# Patient Record
Sex: Male | Born: 1990 | Hispanic: Yes | Marital: Married | Smoking: Light tobacco smoker
Health system: Southern US, Community
[De-identification: ages and names within clinical notes are randomized; demographics above are authoritative.]

---

## 2016-12-10 ENCOUNTER — Emergency Department
Admission: EM | Admit: 2016-12-10 | Discharge: 2016-12-10 | Disposition: A | Discharge status: Home / Self Care | Payer: Worker's Compensation | Attending: Emergency Medicine | Admitting: Emergency Medicine

## 2016-12-10 ENCOUNTER — Encounter: Payer: Self-pay | Admitting: Emergency Medicine

## 2016-12-10 DIAGNOSIS — Y999 Unspecified external cause status: Secondary | ICD-10-CM | POA: Insufficient documentation

## 2016-12-10 DIAGNOSIS — Y929 Unspecified place or not applicable: Secondary | ICD-10-CM | POA: Insufficient documentation

## 2016-12-10 DIAGNOSIS — Y939 Activity, unspecified: Secondary | ICD-10-CM | POA: Diagnosis not present

## 2016-12-10 DIAGNOSIS — S60552A Superficial foreign body of left hand, initial encounter: Secondary | ICD-10-CM | POA: Insufficient documentation

## 2016-12-10 DIAGNOSIS — W458XXA Other foreign body or object entering through skin, initial encounter: Secondary | ICD-10-CM | POA: Insufficient documentation

## 2016-12-10 DIAGNOSIS — S6992XA Unspecified injury of left wrist, hand and finger(s), initial encounter: Secondary | ICD-10-CM | POA: Diagnosis present

## 2016-12-10 MED ORDER — SULFAMETHOXAZOLE-TRIMETHOPRIM 800-160 MG PO TABS: 1.0000 | ORAL_TABLET | Freq: Two times a day (BID) | ORAL | 0 refills | Status: DC

## 2016-12-10 MED ORDER — LIDOCAINE HCL (PF) 1 % IJ SOLN
INTRAMUSCULAR | Status: AC
  Administered 2016-12-10: 12:00:00
  Filled 2016-12-10: qty 5

## 2016-12-10 MED ORDER — OXYCODONE-ACETAMINOPHEN 5-325 MG PO TABS
1.0000 | ORAL_TABLET | Freq: Once | ORAL | Status: AC
  Administered 2016-12-10: 1 via ORAL
  Filled 2016-12-10: qty 1

## 2016-12-10 MED ORDER — SULFAMETHOXAZOLE-TRIMETHOPRIM 800-160 MG PO TABS
1.0000 | ORAL_TABLET | Freq: Once | ORAL | Status: AC
  Administered 2016-12-10: 1 via ORAL
  Filled 2016-12-10: qty 1

## 2016-12-10 MED ORDER — TRAMADOL HCL 50 MG PO TABS: 50.0000 mg | ORAL_TABLET | Freq: Four times a day (QID) | ORAL | 0 refills | Status: AC | PRN

## 2016-12-10 NOTE — ED Provider Notes (Signed)
[facility] Emergency Department Provider Note   ____________________________________________   First MD Initiated Contact with Patient 12/10/16 1116     (approximate)  I have reviewed the triage vital signs and the nursing notes.   HISTORY  Chief Complaint Hand Injury    HPI Manuel Porter is a 26 y.o. male suspected foreign body left hand. Patient stated 2 weeks ago he had a splinter of wood in his left hand. Patient thought he removed it totally on the dating incident. Patient state in the past 2-3 days he feel the foreign body still under the skin.Patient denies loss sensation or loss of function of the affected hand. Increased redness and swelling in the last 24 hours. Patient is right-hand dominant. Patient rates pain as a 5/10. Patient described the pain as "achy". No palliative measures for his complaint.   History reviewed. No pertinent past medical history.  There are no active problems to display for this patient.   History reviewed. No pertinent surgical history.  Prior to Admission medications   Medication Sig Start Date End Date Taking? Authorizing Provider  sulfamethoxazole-trimethoprim (BACTRIM DS,SEPTRA DS) 800-160 MG tablet Take 1 tablet by mouth 2 (two) times daily. 12/10/16   Joni Reiningonald K Smith, PA-C  traMADol (ULTRAM) 50 MG tablet Take 1 tablet (50 mg total) by mouth every 6 (six) hours as needed. 12/10/16 12/10/17  Joni Reiningonald K Smith, PA-C    Allergies Peanut butter flavor and Pineapple  History reviewed. No pertinent family history.  Social History Social History  Substance Use Topics  . Smoking status: Never Smoker  . Smokeless tobacco: Never Used  . Alcohol use No    Review of Systems Constitutional: No fever/chills Eyes: No visual changes. ENT: No sore throat. Cardiovascular: Denies chest pain. Respiratory: Denies shortness of breath. Gastrointestinal: No abdominal pain.  No nausea, no vomiting.  No diarrhea.  No  constipation. Genitourinary: Negative for dysuria. Musculoskeletal: Negative for back pain. Skin: Negative for rash. Foreign body sensation left hand Neurological: Negative for headaches, focal weakness or numbness.    ____________________________________________   PHYSICAL EXAM:  VITAL SIGNS: ED Triage Vitals  Enc Vitals Group     BP 12/10/16 1025 135/82     Pulse Rate 12/10/16 1025 61     Resp 12/10/16 1025 16     Temp 12/10/16 1025 98.1 F (36.7 C)     Temp Source 12/10/16 1025 Oral     SpO2 12/10/16 1025 98 %     Weight 12/10/16 1023 195 lb (88.5 kg)     Height 12/10/16 1023 5\' 8"  (1.727 m)     Head Circumference --      Peak Flow --      Pain Score 12/10/16 1023 5     Pain Loc --      Pain Edu? --      Excl. in GC? --     Constitutional: Alert and oriented. Well appearing and in no acute distress. Eyes: Conjunctivae are normal. PERRL. EOMI. Head: Atraumatic. Nose: No congestion/rhinnorhea. Mouth/Throat: Mucous membranes are moist.  Oropharynx non-erythematous. Neck: No stridor.  No cervical spine tenderness to palpation. Hematological/Lymphatic/Immunilogical: No cervical lymphadenopathy. Cardiovascular: Normal rate, regular rhythm. Grossly normal heart sounds.  Good peripheral circulation. Respiratory: Normal respiratory effort.  No retractions. Lungs CTAB. Gastrointestinal: Soft and nontender. No distention. No abdominal bruits. No CVA tenderness. Musculoskeletal: No lower extremity tenderness nor edema.  No joint effusions. Neurologic:  Normal speech and language. No gross focal neurologic deficits are appreciated.  No gait instability. Skin:  Skin is warm, dry and intact. No rash noted. foreign body  thenar fossa of the left hand. Psychiatric: Mood and affect are normal. Speech and behavior are normal.  ____________________________________________   LABS (all labs ordered are listed, but only abnormal results are displayed)  Labs Reviewed - No data to  display ____________________________________________  EKG   ____________________________________________  RADIOLOGY   ____________________________________________   PROCEDURES  Procedure(s) performed: None  Procedures  Critical Care performed: No  ____________________________________________   INITIAL IMPRESSION / ASSESSMENT AND PLAN / ED COURSE  Pertinent labs & imaging results that were available during my care of the patient were reviewed by me and considered in my medical decision making (see chart for details).  Foreign body left hand. Patient given discharge care instructions. Patient prescription for tramadol and Bactrim DS. Patient advised return back in 2 days for wound check.    ____________________________________________   FINAL CLINICAL IMPRESSION(S) / ED DIAGNOSES  Probable foreign body thenar fossa left hand. Area was surgical clean local block using 1% lidocaine without epinephrine. A #11 blade was used to excise involved area and foreign body was removed intact for this. Patient was bandaged with pressure dressing and advised return back in 2 days for wound check. Final diagnoses:  Foreign body of left hand, initial encounter      NEW MEDICATIONS STARTED DURING THIS VISIT:  New Prescriptions   SULFAMETHOXAZOLE-TRIMETHOPRIM (BACTRIM DS,SEPTRA DS) 800-160 MG TABLET    Take 1 tablet by mouth 2 (two) times daily.   TRAMADOL (ULTRAM) 50 MG TABLET    Take 1 tablet (50 mg total) by mouth every 6 (six) hours as needed.     Note:  This document was prepared using Dragon voice recognition software and may include unintentional dictation errors.    Joni Reining, PA-C 12/10/16 1212    Emily Filbert, MD 12/10/16 [id number]

## 2016-12-10 NOTE — ED Triage Notes (Signed)
Pt reports one week ago he was pulling a piece of wood and piece got in left hand. He thought it was all out but still feels like in there.  Works at [employer]. Reports worker comp.

## 2016-12-10 NOTE — Discharge Instructions (Signed)
Keep wound clean and dry.

## 2016-12-10 NOTE — ED Notes (Signed)
See triage note  States he had gotten a wood splinter in web space of left hand 1 week ago  Unable to remove entire splinter

## 2018-05-24 ENCOUNTER — Other Ambulatory Visit: Payer: Self-pay

## 2018-05-24 ENCOUNTER — Emergency Department: Payer: Self-pay

## 2018-05-24 ENCOUNTER — Emergency Department
Admission: EM | Admit: 2018-05-24 | Discharge: 2018-05-24 | Disposition: A | Discharge status: Home / Self Care | Payer: Self-pay | Attending: Emergency Medicine | Admitting: Emergency Medicine

## 2018-05-24 DIAGNOSIS — Z91010 Allergy to peanuts: Secondary | ICD-10-CM | POA: Insufficient documentation

## 2018-05-24 DIAGNOSIS — M25532 Pain in left wrist: Secondary | ICD-10-CM

## 2018-05-24 MED ORDER — NAPROXEN 500 MG PO TABS: 500.0000 mg | ORAL_TABLET | Freq: Two times a day (BID) | ORAL | 0 refills | Status: AC

## 2018-05-24 NOTE — ED Triage Notes (Signed)
Pt states he twisted his left wrist on Friday. No noted deformity

## 2018-05-24 NOTE — Discharge Instructions (Signed)
The xray of your left wrist is negative for fracture You likely have a wrist strain or tendonitis I have given you a RX for Naproxen 500 mg BID with food Avoid taking Motrin, Advil, Ibuprofen or Aleve OTC You can take Tylenol as needed for pain Wear a compression sleeve to left wrist while working until pain fully resolves

## 2018-05-24 NOTE — ED Notes (Signed)
Esignature pad not working. Unable to sign. Pt verbalized understanding of discharge paperwork and will follow up if necessary.

## 2018-05-24 NOTE — ED Provider Notes (Signed)
[facility] Emergency Department Provider Note ____________________________________________  Time seen: 1510  I have reviewed the triage vital signs and the nursing notes.  HISTORY  Chief Complaint  Wrist Injury   HPI Manuel Porter is a 27 y.o. male presents to the ER today with complaints of left wrist pain.  He reports he twisted his wrist on Friday while at work.  He describes the pain as sore and achy.  The pain radiates up his left forearm. The pain is worse with movement. He has some numbness in his left middle finger.  He denies tingling.  He has noticed some swelling, that seems to be improved today. He denies prior injury or surgery to the area.  He has tried [employer] and Motrin with some relief.  History reviewed. No pertinent past medical history.  There are no active problems to display for this patient.   History reviewed. No pertinent surgical history.  Prior to Admission medications   Medication Sig Start Date End Date Taking? Authorizing Provider  naproxen (NAPROSYN) 500 MG tablet Take 1 tablet (500 mg total) by mouth 2 (two) times daily with a meal. 05/24/18 05/24/19  Lorre Munroe, NP    Allergies Peanut butter flavor and Pineapple  No family history on file.  Social History Social History   Tobacco Use  . Smoking status: Never Smoker  . Smokeless tobacco: Never Used  Substance Use Topics  . Alcohol use: No  . Drug use: No    Review of Systems  Constitutional: Negative for fever. Cardiovascular: Negative for chest pain. Respiratory: Negative for shortness of breath. Musculoskeletal: Positive for left wrist pain.  Negative for back pain. Skin: Positive for swelling.  Negative for redness or warmth. Neurological: Positive for numbness of left middle finger.  Negative for focal weakness. ____________________________________________  PHYSICAL EXAM:  VITAL SIGNS: ED Triage Vitals  Enc Vitals Group     BP 05/24/18 1451  113/74     Pulse Rate 05/24/18 1451 76     Resp 05/24/18 1451 14     Temp 05/24/18 1451 99.1 F (37.3 C)     Temp Source 05/24/18 1451 Oral     SpO2 05/24/18 1451 99 %     Weight 05/24/18 1451 195 lb (88.5 kg)     Height 05/24/18 1451 5\' 6"  (1.676 m)     Head Circumference --      Peak Flow --      Pain Score 05/24/18 1445 6     Pain Loc --      Pain Edu? --      Excl. in GC? --     Constitutional: Alert and oriented. Well appearing and in no distress. Cardiovascular: Radial pulse 2+ bilaterally.  Cap refill less than 3 seconds. Musculoskeletal: Normal flexion, extension and rotation of the left wrist.  He has pain with palpation over the distal radius and distal ulna.  No pain with palpation of the carpals.  No joint swelling noted.  Handgrips equal. Neurologic: Sensation intact to bilateral upper extremities. Skin:  Skin is warm, dry and intact. No redness or warmth noted. ____________________________________________   RADIOLOGY   Imaging Orders     DG Wrist Complete Left  IMPRESSION:  No fracture or dislocation.  No evident arthropathy. ___________________________________________    INITIAL IMPRESSION / ASSESSMENT AND PLAN / ED COURSE  Left Wrist Pain:  DDX include strain, tendonitis, fracture Xray of left wrist negative for fracture eRx for Naproxen 500 mg BID with  food Wear a Neoprene compression sleeve while working until pain completely resolves Ice may be helpful Ok to continue [employer] note provided    ____________________________________________  FINAL CLINICAL IMPRESSION(S) / ED DIAGNOSES  Final diagnoses:  Left wrist pain      Lorre MunroeBaity, Regina W, NP 05/24/18 1544    Dionne BucySiadecki, Sebastian, MD 05/24/18 2321

## 2018-11-08 ENCOUNTER — Emergency Department: Payer: Self-pay

## 2018-11-08 ENCOUNTER — Other Ambulatory Visit: Payer: Self-pay

## 2018-11-08 ENCOUNTER — Emergency Department
Admission: EM | Admit: 2018-11-08 | Discharge: 2018-11-08 | Disposition: A | Discharge status: Home / Self Care | Payer: Self-pay | Attending: Emergency Medicine | Admitting: Emergency Medicine

## 2018-11-08 DIAGNOSIS — Z720 Tobacco use: Secondary | ICD-10-CM | POA: Insufficient documentation

## 2018-11-08 DIAGNOSIS — Z79899 Other long term (current) drug therapy: Secondary | ICD-10-CM | POA: Insufficient documentation

## 2018-11-08 DIAGNOSIS — R0789 Other chest pain: Secondary | ICD-10-CM | POA: Insufficient documentation

## 2018-11-08 LAB — CBC
HEMATOCRIT: 45 % (ref 39.0–52.0)
HEMOGLOBIN: 14.8 g/dL (ref 13.0–17.0)
MCH: 27.8 pg (ref 26.0–34.0)
MCHC: 32.9 g/dL (ref 30.0–36.0)
MCV: 84.4 fL (ref 80.0–100.0)
Platelets: 333 10*3/uL (ref 150–400)
RBC: 5.33 MIL/uL (ref 4.22–5.81)
RDW: 13.3 % (ref 11.5–15.5)
WBC: 7.6 10*3/uL (ref 4.0–10.5)
nRBC: 0 % (ref 0.0–0.2)

## 2018-11-08 LAB — BASIC METABOLIC PANEL
ANION GAP: 6 (ref 5–15)
BUN: 19 mg/dL (ref 6–20)
CHLORIDE: 107 mmol/L (ref 98–111)
CO2: 25 mmol/L (ref 22–32)
Calcium: 9.3 mg/dL (ref 8.9–10.3)
Creatinine, Ser: 1.12 mg/dL (ref 0.61–1.24)
GFR calc Af Amer: >60 mL/min (ref >60)
Glucose, Bld: 92 mg/dL (ref 70–99)
POTASSIUM: 3.8 mmol/L (ref 3.5–5.1)
SODIUM: 138 mmol/L (ref 135–145)

## 2018-11-08 LAB — TROPONIN I

## 2018-11-08 NOTE — ED Provider Notes (Signed)
[facility] Emergency Department Provider Note   ____________________________________________   First MD Initiated Contact with Patient 11/08/18 2014     (approximate)  I have reviewed the triage vital signs and the nursing notes.   HISTORY  Chief Complaint Chest Pain    HPI Manuel Porter is a 28 y.o. male here for evaluation of left-sided chest pain  Patient reports that for about 3 to 4 months time he is noticed twinges of pain in the left side of chest.  Sometimes comes and goes for a couple of seconds.  Feels like a sharp discomfort over the left side of his ribs.  Sometimes will notice he is little short of breath after he exerts himself for about 10 minutes, but reports he cannot thinks it might be normal as he is getting older.  No leg swelling.  No history of blood clots.  No recent long trips or travel.  Smokes about 1 cigarette every 3 months  Denies any heavy chest pressure.  No radiation to the neck.  Yesterday when he was drinking hot coffee the pain seemed to shoot a little bit down into his left arm but went away.  No abdominal pain.  No fevers or chills.  Does have a history of pericarditis about 10 years ago, but reports this does not seem to be similar but he wonders if it could have somehow been related.  He also reports he had this pain and discomfort in [place] once, and he went and got seen by Dr. and was told everything looked reassuring.  Never had a stress test   History reviewed. No pertinent past medical history.  There are no active problems to display for this patient.   History reviewed. No pertinent surgical history.  Prior to Admission medications   Medication Sig Start Date End Date Taking? Authorizing Provider  naproxen (NAPROSYN) 500 MG tablet Take 1 tablet (500 mg total) by mouth 2 (two) times daily with a meal. 05/24/18 05/24/19  Lorre Munroe, NP    Allergies Peanut butter flavor and Pineapple  History  reviewed. No pertinent family history.  Social History Social History   Tobacco Use  . Smoking status: Light Tobacco Smoker  . Smokeless tobacco: Never Used  Substance Use Topics  . Alcohol use: Yes  . Drug use: No    Review of Systems Constitutional: No fever/chills ENT: No sore throat. Cardiovascular: The HPI  respiratory: Denies shortness of breath. Gastrointestinal: No abdominal pain.   Musculoskeletal: Negative for back pain. Skin: Negative for rash. Neurological: Negative for headaches, areas of focal weakness or numbness.    ____________________________________________   PHYSICAL EXAM:  VITAL SIGNS: ED Triage Vitals [11/08/18 1705]  Enc Vitals Group     BP 132/73     Pulse Rate 75     Resp 18     Temp 98.5 F (36.9 C)     Temp Source Oral     SpO2 100 %     Weight 200 lb (90.7 kg)     Height 5\' 8"  (1.727 m)     Head Circumference      Peak Flow      Pain Score 8     Pain Loc      Pain Edu?      Excl. in GC?     Constitutional: Alert and oriented. Well appearing and in no acute distress. Eyes: Conjunctivae are normal. Head: Atraumatic. Nose: No congestion/rhinnorhea. Mouth/Throat: Mucous membranes are moist.  Neck: No stridor.  Cardiovascular: Normal rate, regular rhythm. Grossly normal heart sounds.  Good peripheral circulation.  Not in pain at present.  No reproducible chest pain on examination. Respiratory: Normal respiratory effort.  No retractions. Lungs CTAB. Gastrointestinal: Soft and nontender. No distention. Musculoskeletal: No lower extremity tenderness nor edema.  No leg swelling. Neurologic:  Normal speech and language. No gross focal neurologic deficits are appreciated.  Skin:  Skin is warm, dry and intact. No rash noted. Psychiatric: Mood and affect are normal. Speech and behavior are normal.  ____________________________________________   LABS (all labs ordered are listed, but only abnormal results are displayed)  Labs Reviewed    BASIC METABOLIC PANEL  CBC  TROPONIN I   ____________________________________________  EKG  Reviewed entered by me at 1700 Heart rate 70 QRS 99 QTc 400 Normal sinus rhythm, no evidence of acute ischemia.  Hint of early repolarization changes. ____________________________________________  RADIOLOGY  Dg Chest 2 View  Result Date: 11/08/2018 CLINICAL DATA:  LEFT side chest pain for couple months worse today, light smoker EXAM: CHEST - 2 VIEW COMPARISON:  None FINDINGS: Normal heart size, mediastinal contours, and pulmonary vascularity. Minimal peribronchial thickening. No pulmonary infiltrate, pleural effusion, or pneumothorax. Bones unremarkable. IMPRESSION: Minimal bronchitic changes without infiltrate. Electronically Signed   By: Ulyses Southward M.D.   On: 11/08/2018 18:03    Chest x-ray mild bronchitic changes no acute ____________________________________________   PROCEDURES  Procedure(s) performed: None  Procedures  Critical Care performed: No  ____________________________________________   INITIAL IMPRESSION / ASSESSMENT AND PLAN / ED COURSE  Pertinent labs & imaging results that were available during my care of the patient were reviewed by me and considered in my medical decision making (see chart for details).   Differential diagnosis includes, but is not limited to, ACS, aortic dissection, pulmonary embolism, cardiac tamponade, pneumothorax, pneumonia, pericarditis, myocarditis, GI-related causes including esophagitis/gastritis, and musculoskeletal chest wall pain.    No viral-like syndromes.  No worsening of symptoms with laying flat.  EKG without evidence of pericarditis.  Troponin normal.  Patient is low risk for ACS by heart score.  Chest x-ray reassuring.      Pulmonary Embolism Rule-out Criteria (PERC rule)                        If YES to ANY of the following, the PERC rule is not satisfied and cannot be used to rule out PE in this patient (consider d-dimer  or imaging depending on pre-test probability).                      If NO to ALL of the following, AND the clinician's pre-test probability is <15%, the [facility] rule is satisfied and there is no need for further workup (including no need to obtain a d-dimer) as the post-test probability of pulmonary embolism is <2%.                      Mnemonic is HAD CLOTS   H - hormone use (exogenous estrogen)      No. A - age > 50                                                 No. D - DVT/PE history  No.   C - coughing blood (hemoptysis)                 No. L - leg swelling, unilateral                             No. O - O2 Sat on Room Air < 95%                  No. T - tachycardia (HR ? 100)                         No. S - surgery or trauma, recent                      No.   Based on my evaluation of the patient, including application of this decision instrument, further testing to evaluate for pulmonary embolism is not indicated at this time.   Heart score < 3.  Symptoms atypical.  No movement to the back, no ripping or tearing pain.  No pleuritic component.  Ports symptoms seem to be worsened with things like drinking hot liquids, sometimes notices with he has been walking for 10 minutes but also sometimes notices this pain or twinge will come for a couple of seconds as well while he is sitting at rest.  Does not appear to be exertional in nature per se.  Discussed with the patient, he is comfortable with the plan to follow-up outpatient with cardiology for further evaluation and careful return precautions.  Return precautions and treatment recommendations and follow-up discussed with the patient who is agreeable with the plan.        ____________________________________________   FINAL CLINICAL IMPRESSION(S) / ED DIAGNOSES  Final diagnoses:  Atypical chest pain        Note:  This document was prepared using Dragon voice recognition software and may  include unintentional dictation errors       Sharyn CreamerQuale, Mark, MD 11/08/18 2031

## 2018-11-08 NOTE — ED Triage Notes (Signed)
Pt states CP x "a couple days and a couple months" states CP is normal for him but became worse today. States numbness in L arm. States in 2011 "pericardium" pt unsure what it was called. States pain comes and goes.

## 2018-11-08 NOTE — Discharge Instructions (Signed)

## 2018-11-08 NOTE — ED Notes (Signed)
ED Provider at bedside. 

## 2018-12-15 ENCOUNTER — Emergency Department

## 2018-12-15 ENCOUNTER — Encounter: Payer: Self-pay | Admitting: *Deleted

## 2018-12-15 ENCOUNTER — Other Ambulatory Visit: Payer: Self-pay

## 2018-12-15 ENCOUNTER — Emergency Department
Admission: EM | Admit: 2018-12-15 | Discharge: 2018-12-15 | Disposition: A | Discharge status: Home / Self Care | Attending: Emergency Medicine | Admitting: Emergency Medicine

## 2018-12-15 DIAGNOSIS — F17200 Nicotine dependence, unspecified, uncomplicated: Secondary | ICD-10-CM | POA: Insufficient documentation

## 2018-12-15 DIAGNOSIS — Z91010 Allergy to peanuts: Secondary | ICD-10-CM | POA: Insufficient documentation

## 2018-12-15 DIAGNOSIS — Z79899 Other long term (current) drug therapy: Secondary | ICD-10-CM | POA: Diagnosis not present

## 2018-12-15 DIAGNOSIS — W010XXA Fall on same level from slipping, tripping and stumbling without subsequent striking against object, initial encounter: Secondary | ICD-10-CM | POA: Diagnosis not present

## 2018-12-15 DIAGNOSIS — Y990 Civilian activity done for income or pay: Secondary | ICD-10-CM | POA: Diagnosis not present

## 2018-12-15 DIAGNOSIS — M25562 Pain in left knee: Secondary | ICD-10-CM

## 2018-12-15 DIAGNOSIS — Y9289 Other specified places as the place of occurrence of the external cause: Secondary | ICD-10-CM | POA: Diagnosis not present

## 2018-12-15 DIAGNOSIS — Y9389 Activity, other specified: Secondary | ICD-10-CM | POA: Diagnosis not present

## 2018-12-15 DIAGNOSIS — S8992XA Unspecified injury of left lower leg, initial encounter: Secondary | ICD-10-CM | POA: Diagnosis present

## 2018-12-15 DIAGNOSIS — S83412A Sprain of medial collateral ligament of left knee, initial encounter: Secondary | ICD-10-CM

## 2018-12-15 MED ORDER — IBUPROFEN 600 MG PO TABS: 600.0000 mg | ORAL_TABLET | Freq: Four times a day (QID) | ORAL | 0 refills | Status: AC | PRN

## 2018-12-15 MED ORDER — HYDROCODONE-ACETAMINOPHEN 5-325 MG PO TABS
1.0000 | ORAL_TABLET | ORAL | Status: AC
  Administered 2018-12-15: 1 via ORAL
  Filled 2018-12-15: qty 1

## 2018-12-15 NOTE — ED Triage Notes (Signed)
PT is to ED after a fall at work. Fall form less than 1 foot off the ground and pt reporting left knee pain. Pain when bearing weight and decreased mobility.   Workers comp case.

## 2018-12-15 NOTE — ED Provider Notes (Signed)
[facility] EMERGENCY DEPARTMENT Provider Note   CSN: [id number] Arrival date & time: 12/15/18  1714     History   Chief Complaint Chief Complaint  Patient presents with  . Knee Pain    HPI Manuel Porter is a 28 y.o. male.  Presents to the emergency department for evaluation of left knee pain.  Around 4 PM today patient was at work standing on a 6 inch platform when he twisted and fell injuring his left knee.  He complains of pain along the medial joint line.  He is unable to bear weight.  He has not any medications for pain.  Pain is severe.  He denies any hip or ankle pain.  Pain is described as sharp with attempted range of motion.  He is able to straight leg raise.  HPI  History reviewed. No pertinent past medical history.  There are no active problems to display for this patient.   History reviewed. No pertinent surgical history.      Home Medications    Prior to Admission medications   Medication Sig Start Date End Date Taking? Authorizing Provider  ibuprofen (ADVIL,MOTRIN) 600 MG tablet Take 1 tablet (600 mg total) by mouth every 6 (six) hours as needed for moderate pain. 12/15/18   Evon Slack, PA-C  naproxen (NAPROSYN) 500 MG tablet Take 1 tablet (500 mg total) by mouth 2 (two) times daily with a meal. 05/24/18 05/24/19  Lorre Munroe, NP    Family History History reviewed. No pertinent family history.  Social History Social History   Tobacco Use  . Smoking status: Light Tobacco Smoker  . Smokeless tobacco: Never Used  Substance Use Topics  . Alcohol use: Yes  . Drug use: No     Allergies   Peanut butter flavor and Pineapple   Review of Systems Review of Systems  Constitutional: Positive for fever. Negative for chills.  Gastrointestinal: Negative for nausea and vomiting.  Musculoskeletal: Positive for arthralgias, gait problem, joint swelling and myalgias. Negative for neck pain and neck stiffness.  Skin: Negative  for rash and wound.     Physical Exam Updated Vital Signs Ht 5\' 6"  (1.676 m)   Wt 90.7 kg   BMI 32.28 kg/m   Physical Exam Constitutional:      Appearance: Normal appearance. He is normal weight.  HENT:     Head: Normocephalic and atraumatic.     Nose: Nose normal.     Mouth/Throat:     Mouth: Mucous membranes are moist.  Eyes:     Conjunctiva/sclera: Conjunctivae normal.  Neck:     Musculoskeletal: Normal range of motion.  Cardiovascular:     Pulses: Normal pulses.     Heart sounds: Normal heart sounds.  Pulmonary:     Effort: Pulmonary effort is normal.     Breath sounds: Normal breath sounds.  Musculoskeletal:     Comments: Examination of the left lower extremity shows patient is able straight leg raise.  Slight laxity with valgus stress testing.  Tender along the MCL.  Painful anterior drawer test but no significant laxity.  No swelling or edema throughout the calf.  He is neurovascular intact left lower extremity.  Neurological:     Mental Status: He is alert.      ED Treatments / Results  Labs (all labs ordered are listed, but only abnormal results are displayed) Labs Reviewed - No data to display  EKG None  Radiology Dg Knee Complete 4 Views Left  Result Date: 12/15/2018 CLINICAL DATA:  Left knee pain after fall at work. EXAM: LEFT KNEE - COMPLETE 4+ VIEW COMPARISON:  None. FINDINGS: No evidence of fracture, dislocation, or joint effusion. No evidence of arthropathy or other focal bone abnormality. Mild soft tissue induration and swelling along the anterior distal thigh and prepatellar soft tissues. IMPRESSION: Mild soft tissue swelling of the distal anterior thigh and knee. No acute osseous abnormality of the left knee. Electronically Signed   By: Tollie Eth M.D.   On: 12/15/2018 18:10    Procedures .Splint Application Date/Time: 12/15/2018 6:47 PM Performed by: Evon Slack, PA-C Authorized by: Evon Slack, PA-C   Consent:    Consent  obtained:  Verbal   Consent given by:  Patient Pre-procedure details:    Sensation:  Normal Procedure details:    Laterality:  Left   Location:  Knee   Knee:  L knee   Splint type:  Knee immobilizer Post-procedure details:    Pain:  Improved   Sensation:  Normal   Patient tolerance of procedure:  Tolerated well, no immediate complications   (including critical care time)  Medications Ordered in ED Medications  HYDROcodone-acetaminophen (NORCO/VICODIN) 5-325 MG per tablet 1 tablet (1 tablet Oral Given 12/15/18 1835)     Initial Impression / Assessment and Plan / ED Course  I have reviewed the triage vital signs and the nursing notes.  Pertinent labs & imaging results that were available during my care of the patient were reviewed by me and considered in my medical decision making (see chart for details).     28 year old male with left knee pain after a fall at work.  Physical exam concerning for MCL strain.  X-rays of the knee show no evidence of acute bony or normality.  No concern for quad tendon or patellar tendon tear.  Recommend follow-up with orthopedics in 2 to 3 days for recheck.  He will stay nonweightbearing with crutches and knee immobilizer.  He will alternate Tylenol and ibuprofen for pain. Final Clinical Impressions(s) / ED Diagnoses   Final diagnoses:  Sprain of medial collateral ligament of left knee, initial encounter  Acute pain of left knee    ED Discharge Orders         Ordered    ibuprofen (ADVIL,MOTRIN) 600 MG tablet  Every 6 hours PRN     12/15/18 1841           Evon Slack, PA-C 12/15/18 Elsie Amis, MD 12/16/18 0031

## 2018-12-15 NOTE — ED Notes (Signed)
See triage note  States he missed a step  Fell twisted left leg  Larey Seat  Landed on left knee and left hand

## 2018-12-15 NOTE — ED Notes (Signed)
Collected urin drug screen sample, walked sample to lab, placed all required paper work with pt's chart.

## 2018-12-15 NOTE — Discharge Instructions (Addendum)
Please rest ice and elevate the left knee.  Avoid weightbearing until you are able to walk without a limp.  Call orthopedic office schedule follow-up appointment.  You may take 600 mg of ibuprofen every 6 hours along with 1000 mg of Tylenol every 6 hours.  Return to the emergency department for any worsening pain or any urgent changes in your health.

## 2018-12-25 ENCOUNTER — Other Ambulatory Visit: Payer: Self-pay | Admitting: Orthopedic Surgery

## 2018-12-25 DIAGNOSIS — M25562 Pain in left knee: Secondary | ICD-10-CM

## 2018-12-25 DIAGNOSIS — M25462 Effusion, left knee: Secondary | ICD-10-CM

## 2019-01-02 ENCOUNTER — Ambulatory Visit: Admission: RE | Admit: 2019-01-02 | Payer: Self-pay | Source: Ambulatory Visit

## 2019-02-01 ENCOUNTER — Telehealth: Payer: Self-pay

## 2019-02-01 NOTE — Telephone Encounter (Signed)
Virtual Visit Pre-Appointment Phone Call  Steps For Call:  1. Confirm consent - "In the setting of the current Covid19 crisis, you are scheduled for a VIDEO visit with your provider on 02/05/2019 at 3:40pm .  Just as we do with many in-office visits, in order for you to participate in this visit, we must obtain consent.  If you'd like, I can send this to your mychart (if signed up) or email for you to review.  Otherwise, I can obtain your verbal consent now.  All virtual visits are billed to your insurance company just like a normal visit would be.  By agreeing to a virtual visit, we'd like you to understand that the technology does not allow for your provider to perform an examination, and thus may limit your provider's ability to fully assess your condition.  Finally, though the technology is pretty good, we cannot assure that it will always work on either your or our end, and in the setting of a video visit, we may have to convert it to a phone-only visit.  In either situation, we cannot ensure that we have a secure connection.  Are you willing to proceed?"  2. Give patient instructions for WebEx download to smartphone as below if video visit  3. Advise patient to be prepared with any vital sign or heart rhythm information, their current medicines, and a piece of paper and pen handy for any instructions they may receive the day of their visit  4. Inform patient they will receive a phone call 15 minutes prior to their appointment time (may be from unknown caller ID) so they should be prepared to answer  5. Confirm that appointment type is correct in Epic appointment notes (video vs telephone)    TELEPHONE CALL NOTE  Manuel Porter has been deemed a candidate for a follow-up tele-health visit to limit community exposure during the Covid-19 pandemic. I spoke with the patient via phone to ensure availability of phone/video source, confirm preferred email & phone number, and discuss  instructions and expectations.  I reminded Manuel Porter to be prepared with any vital sign and/or heart rhythm information that could potentially be obtained via home monitoring, at the time of his visit. I reminded Manuel Porter to expect a phone call at the time of his visit if his visit.  Did the patient verbally acknowledge consent to treatment? YES  Margrett Rud, [place] 02/01/2019 2:36 PM    CONSENT FOR TELE-HEALTH VISIT - PLEASE REVIEW  I hereby voluntarily request, consent and authorize [facility] and its employed or contracted physicians, physician assistants, nurse practitioners or other licensed health care professionals (the Practitioner), to provide me with telemedicine health care services (the Services") as deemed necessary by the treating Practitioner. I acknowledge and consent to receive the Services by the Practitioner via telemedicine. I understand that the telemedicine visit will involve communicating with the Practitioner through live audiovisual communication technology and the disclosure of certain medical information by electronic transmission. I acknowledge that I have been given the opportunity to request an in-person assessment or other available alternative prior to the telemedicine visit and am voluntarily participating in the telemedicine visit.  I understand that I have the right to withhold or withdraw my consent to the use of telemedicine in the course of my care at any time, without affecting my right to future care or treatment, and that the Practitioner or I may terminate the telemedicine visit at any time. I understand that I have  the right to inspect all information obtained and/or recorded in the course of the telemedicine visit and may receive copies of available information for a reasonable fee.  I understand that some of the potential risks of receiving the Services via telemedicine include:   Delay or interruption in medical evaluation due to  technological equipment failure or disruption;  Information transmitted may not be sufficient (e.g. poor resolution of images) to allow for appropriate medical decision making by the Practitioner; and/or   In rare instances, security protocols could fail, causing a breach of personal health information.  Furthermore, I acknowledge that it is my responsibility to provide information about my medical history, conditions and care that is complete and accurate to the best of my ability. I acknowledge that Practitioner's advice, recommendations, and/or decision may be based on factors not within their control, such as incomplete or inaccurate data provided by me or distortions of diagnostic images or specimens that may result from electronic transmissions. I understand that the practice of medicine is not an exact science and that Practitioner makes no warranties or guarantees regarding treatment outcomes. I acknowledge that I will receive a copy of this consent concurrently upon execution via email to the email address I last provided but may also request a printed copy by calling the office of [place].    I understand that my insurance will be billed for this visit.   I have read or had this consent read to me.  I understand the contents of this consent, which adequately explains the benefits and risks of the Services being provided via telemedicine.   I have been provided ample opportunity to ask questions regarding this consent and the Services and have had my questions answered to my satisfaction.  I give my informed consent for the services to be provided through the use of telemedicine in my medical care  By participating in this telemedicine visit I agree to the above.

## 2019-02-04 NOTE — Progress Notes (Unsigned)
Telephone Visit     Evaluation Performed:  Follow-up visit  This visit type was conducted due to national recommendations for restrictions regarding the COVID-19 Pandemic (e.g. social distancing).  This format is felt to be most appropriate for this patient at this time.  All issues noted in this document were discussed and addressed.  No physical exam was performed (except for noted visual exam findings with Telehealth visits).  See MyChart message from today for the patient's consent to telehealth for [facility].  Date:  02/04/2019   ID:  Manuel Porter, DOB 1991-09-05, MRN [id number]  Patient Location:  [address] [place] [address]   Provider location:   [facility], [place] office  PCP:  Patient, No Pcp Per  Cardiologist:  New to [facility]  Chief Complaint:  Chest pain    History of Present Illness:    Manuel Porter is a 28 y.o. male who presents via audio/video conferencing for a telehealth visit today.   The patient does not symptoms concerning for COVID-19 infection (fever, chills, cough, or new SHORTNESS OF BREATH).   Patient has a past medical history of Chest pain Seen in the emergency room January 2020 Referred by emergency room physicians for evaluation today for his chest pain symptoms  Back in January 2020 reported having  twinges of pain in the left side of chest.   Sometimes comes and goes for a couple of seconds.   Feels like a sharp discomfort over the left side of his ribs.   Sometimes will notice he is little short of breath after he exerts himself for about 10 minutes, but reports he cannot thinks it might be normal as he is getting older.    No leg swelling.  No history of blood clots.  No recent long trips or travel.    Smokes about 1 cigarette every 3 months  history of pericarditis about 10 years ago, but reports this does not seem to be similar    He also reports he had this pain and discomfort in [place] once, and he  went and got seen by Dr. and was told everything looked reassuring.    Prior CV studies:   The following studies were reviewed today:    No past medical history on file. No past surgical history on file.   No outpatient medications have been marked as taking for the 02/05/19 encounter (Appointment) with Antonieta Iba, MD.     Allergies:   Peanut butter flavor and Pineapple   Social History   Tobacco Use  . Smoking status: Light Tobacco Smoker  . Smokeless tobacco: Never Used  Substance Use Topics  . Alcohol use: Yes  . Drug use: No     Current Outpatient Medications on File Prior to Visit  Medication Sig Dispense Refill  . ibuprofen (ADVIL,MOTRIN) 600 MG tablet Take 1 tablet (600 mg total) by mouth every 6 (six) hours as needed for moderate pain. 30 tablet 0  . naproxen (NAPROSYN) 500 MG tablet Take 1 tablet (500 mg total) by mouth 2 (two) times daily with a meal. 30 tablet 0   No current facility-administered medications on file prior to visit.      Family Hx: The patient's family history is not on file.  ROS:   Please see the history of present illness.    Review of Systems  Constitutional: Negative.   Respiratory: Negative.   Cardiovascular: Negative.   Gastrointestinal: Negative.   Musculoskeletal: Negative.  Neurological: Negative.   Psychiatric/Behavioral: Negative.   All other systems reviewed and are negative.     Labs/Other Tests and Data Reviewed:    Recent Labs: 11/08/2018: BUN 19; Creatinine, Ser 1.12; Hemoglobin 14.8; Platelets 333; Potassium 3.8; Sodium 138   Recent Lipid Panel No results found for: CHOL, TRIG, HDL, CHOLHDL, LDLCALC, LDLDIRECT  Wt Readings from Last 3 Encounters:  12/15/18 200 lb (90.7 kg)  11/08/18 200 lb (90.7 kg)  05/24/18 195 lb (88.5 kg)     Exam:    Vital Signs: Vital signs as detailed above in HPI  Well nourished, well developed male in no acute distress. Constitutional:  oriented to person, place, and time.  No distress.  Head: Normocephalic and atraumatic.  Eyes:  no discharge. No scleral icterus.  Neck: Normal range of motion. Neck supple.  Pulmonary/Chest: No audible wheezing, no distress, appears comfortable Musculoskeletal: Normal range of motion.  no  tenderness or deformity.  Neurological:   Coordination normal. Full exam not performed Skin:  No rash Psychiatric:  normal mood and affect. behavior is normal. Thought content normal.    ASSESSMENT & PLAN:     No diagnosis found.    COVID-19 Education: The signs and symptoms of COVID-19 were discussed with the patient and how to seek care for testing (follow up with PCP or arrange E-visit).  The importance of social distancing was discussed today.  Patient Risk:   After full review of this patients clinical status, I feel that they are at least moderate risk at this time.  Time:   Today, I have spent 25 minutes with the patient with telehealth technology discussing .     Medication Adjustments/Labs and Tests Ordered: Current medicines are reviewed at length with the patient today.  Concerns regarding medicines are outlined above.   Tests Ordered: No tests ordered   Medication Changes: No changes made   Disposition: Follow-up in 6 months   Signed, Julien Nordmann, MD  02/04/2019 10:24 PM    [facility] [address], [place], [place] [address]

## 2019-02-05 ENCOUNTER — Other Ambulatory Visit: Payer: Self-pay

## 2019-02-05 ENCOUNTER — Telehealth: Payer: Self-pay | Admitting: Cardiovascular Disease

## 2019-02-16 ENCOUNTER — Ambulatory Visit: Payer: Self-pay | Admitting: Cardiovascular Disease

## 2019-05-24 IMAGING — DX DG KNEE COMPLETE 4+V*L*
5 series · 5 of 5 positions shown · non-contrast
Comparison: None.

CLINICAL DATA: Left knee pain after fall at work.

EXAM:
LEFT KNEE - COMPLETE 4+ VIEW

[knee ap]
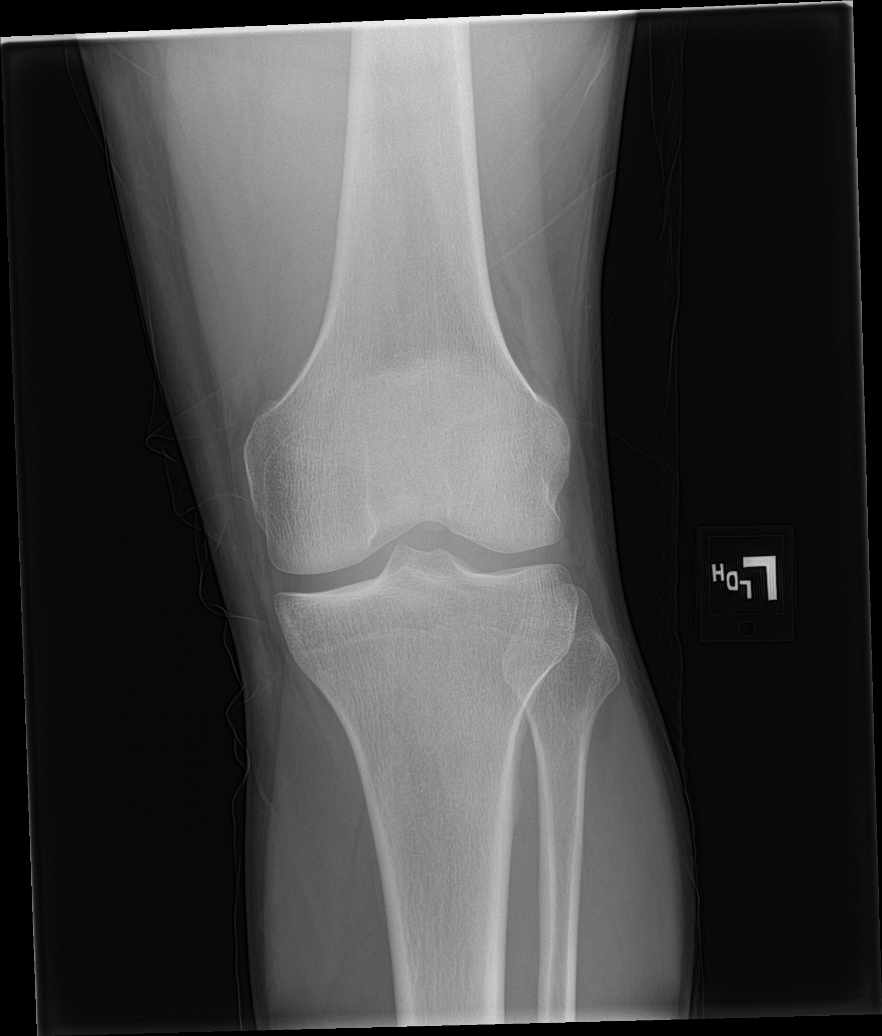

[knee tunnel]
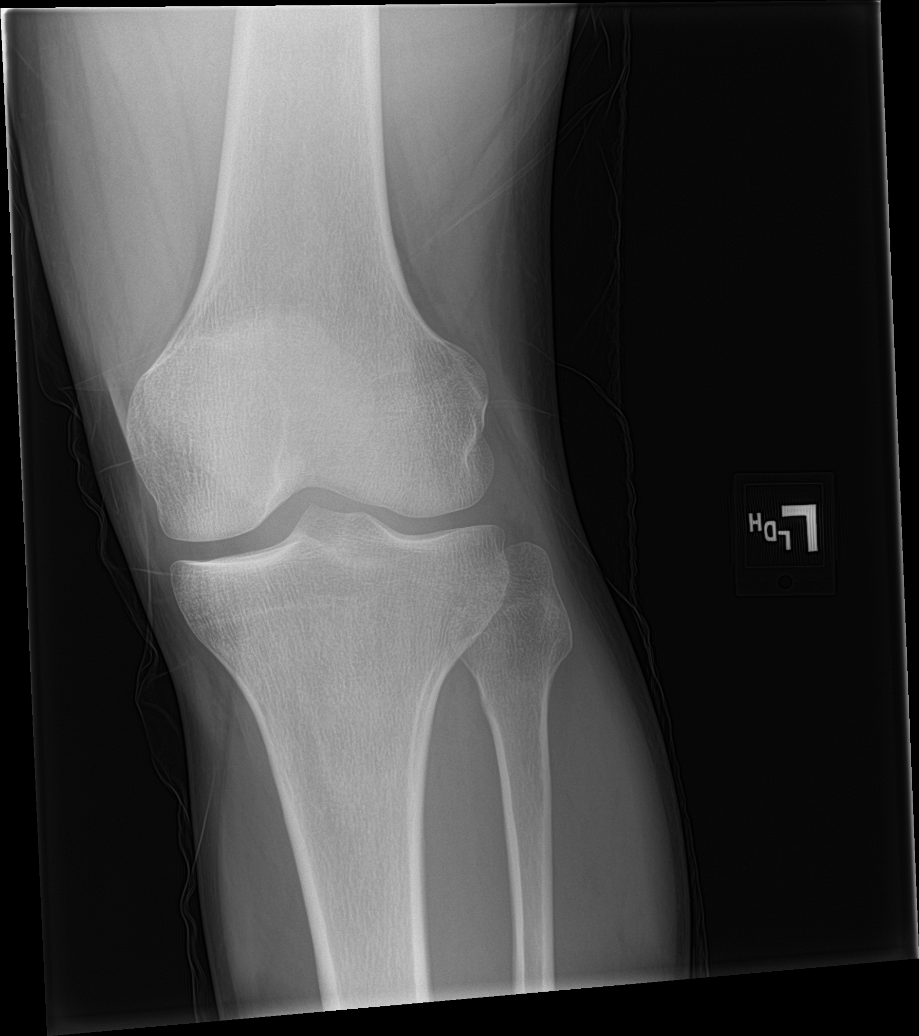

[knee lat]
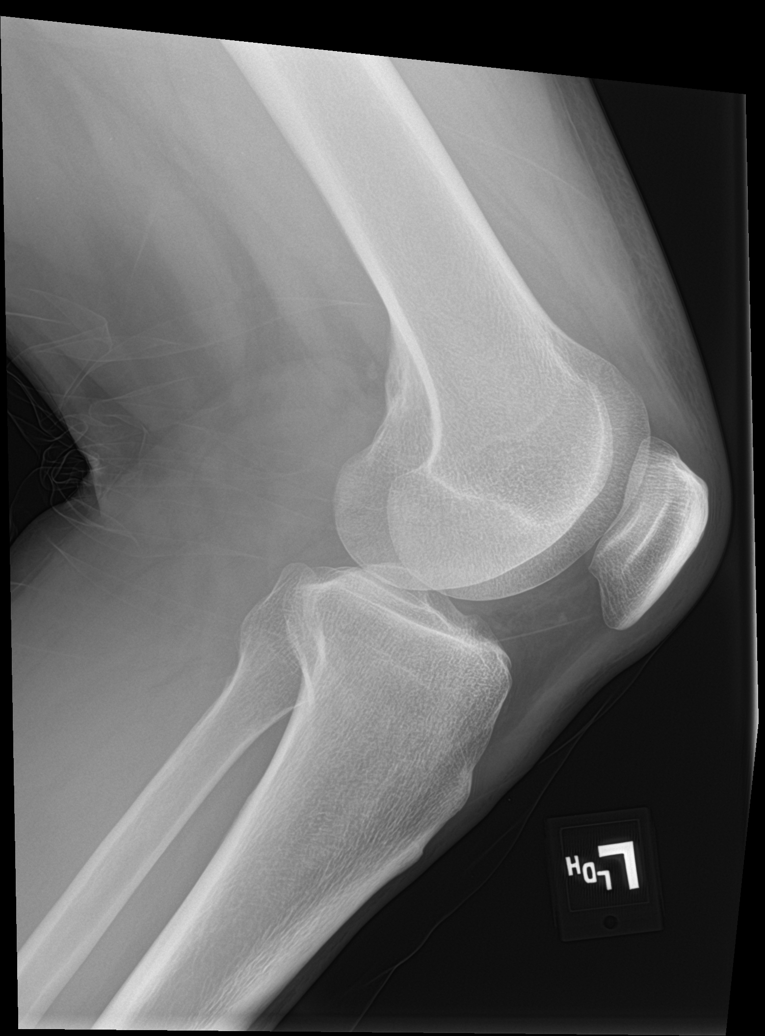

[knee obl (1 of 2)]
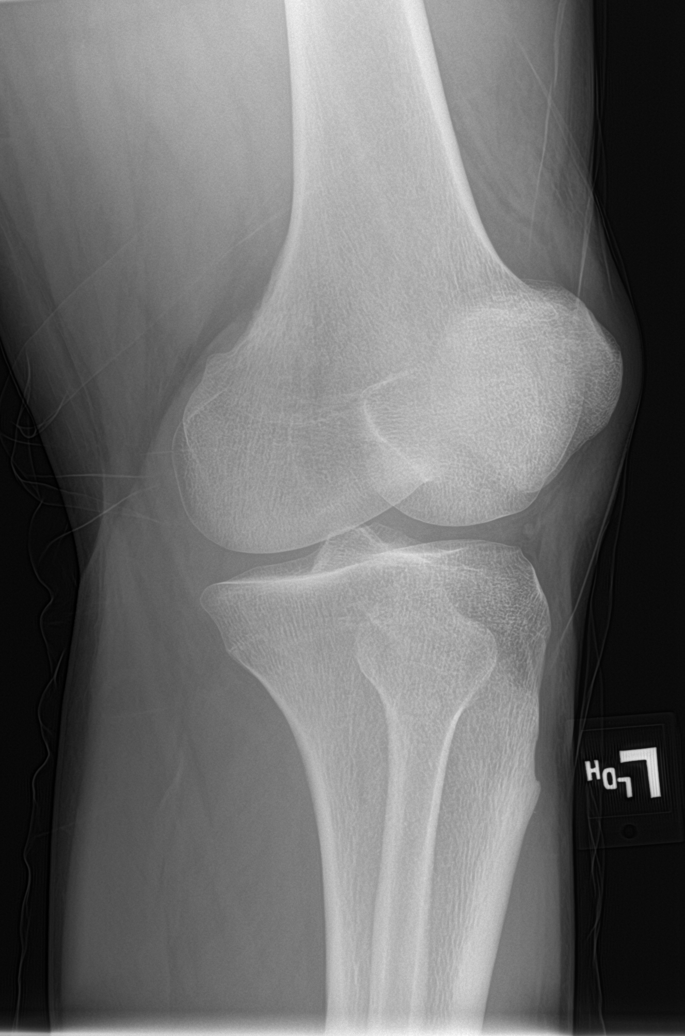

[knee obl (2 of 2)]
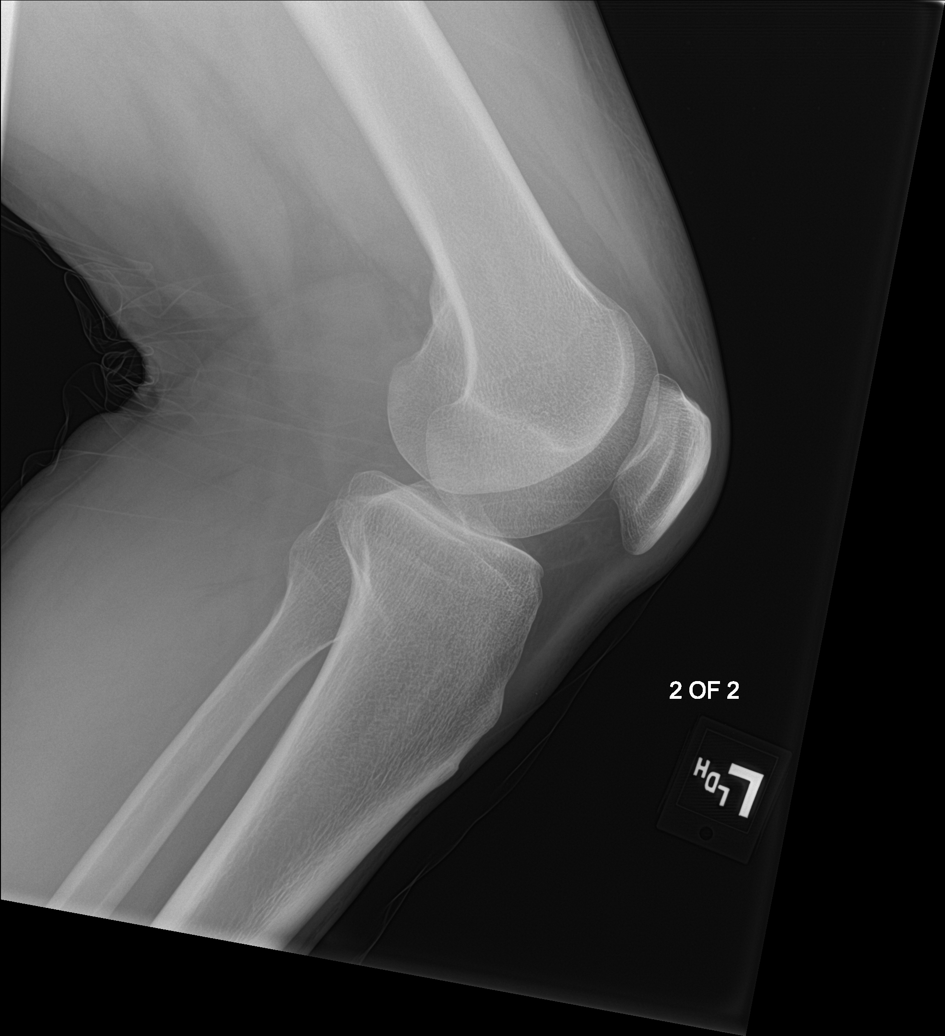

[5 of 5 positions shown; findings below may reference images not displayed]

FINDINGS: No evidence of fracture, dislocation, or joint effusion. No evidence
of arthropathy or other focal bone abnormality. Mild soft tissue
induration and swelling along the anterior distal thigh and
prepatellar soft tissues.
IMPRESSION: Mild soft tissue swelling of the distal anterior thigh and knee. No
acute osseous abnormality of the left knee.
